# Patient Record
Sex: Male | Born: 2000 | Hispanic: Yes | Marital: Single | Smoking: Never smoker
Health system: Southern US, Community
[De-identification: ages and names within clinical notes are randomized; demographics above are authoritative.]

## PROBLEM LIST (undated history)

## (undated) DIAGNOSIS — J45909 Unspecified asthma, uncomplicated: Secondary | ICD-10-CM

---
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
## 2024-04-03 ENCOUNTER — Ambulatory Visit (INDEPENDENT_AMBULATORY_CARE_PROVIDER_SITE_OTHER): Payer: Self-pay | Admitting: Family

## 2024-04-03 ENCOUNTER — Encounter: Payer: Self-pay | Admitting: Family

## 2024-04-03 VITALS — BP 110/75 | HR 67 | Ht 69.5 in | Wt 202.8 lb

## 2024-04-03 DIAGNOSIS — R21 Rash and other nonspecific skin eruption: Secondary | ICD-10-CM

## 2024-04-03 DIAGNOSIS — Z114 Encounter for screening for human immunodeficiency virus [HIV]: Secondary | ICD-10-CM | POA: Diagnosis not present

## 2024-04-03 DIAGNOSIS — Z13220 Encounter for screening for lipoid disorders: Secondary | ICD-10-CM | POA: Diagnosis not present

## 2024-04-03 DIAGNOSIS — Z7689 Persons encountering health services in other specified circumstances: Secondary | ICD-10-CM

## 2024-04-03 DIAGNOSIS — Z1329 Encounter for screening for other suspected endocrine disorder: Secondary | ICD-10-CM | POA: Diagnosis not present

## 2024-04-03 DIAGNOSIS — Z1159 Encounter for screening for other viral diseases: Secondary | ICD-10-CM

## 2024-04-03 DIAGNOSIS — Z0000 Encounter for general adult medical examination without abnormal findings: Secondary | ICD-10-CM | POA: Diagnosis not present

## 2024-04-03 DIAGNOSIS — Z0289 Encounter for other administrative examinations: Secondary | ICD-10-CM

## 2024-04-03 DIAGNOSIS — Z130 Encounter for screening for diseases of the blood and blood-forming organs and certain disorders involving the immune mechanism: Secondary | ICD-10-CM | POA: Diagnosis not present

## 2024-04-03 DIAGNOSIS — Z131 Encounter for screening for diabetes mellitus: Secondary | ICD-10-CM

## 2024-04-03 DIAGNOSIS — Z13228 Encounter for screening for other metabolic disorders: Secondary | ICD-10-CM | POA: Diagnosis not present

## 2024-04-03 MED ORDER — TRIAMCINOLONE ACETONIDE 0.025 % EX CREA: 1.0000 | TOPICAL_CREAM | Freq: Two times a day (BID) | CUTANEOUS | 1 refills | Status: AC

## 2024-04-03 NOTE — Progress Notes (Signed)
 Subjective:    Alan Fischer - 23 y.o. male MRN [id number]  Date of birth: Jun 22, 2001  HPI  Alan Fischer is to establish care and annual exam.   Current issues and/or concerns: - Rash of bilateral arms present for a while. Denies red flag symptoms.  - Patient has a form from his employer for primary provider to sign that he received his annual exam on today.  - No further issues/concerns for discussion today.  ROS per HPI     Health Maintenance:  Health Maintenance Due  Topic Date Due   HPV VACCINES (1 - Male 3-dose series) Never done   HIV Screening  Never done   Meningococcal B Vaccine (1 of 2 - Standard) Never done   Hepatitis C Screening  Never done   DTaP/Tdap/Td (1 - Tdap) Never done   COVID-19 Vaccine (1 - 2024-25 season) Never done     Past Medical History: There are no active problems to display for this patient.     Social History   reports that he has never smoked. He has never used smokeless tobacco. He reports that he does not drink alcohol and does not use drugs.   Family History  family history is not on file.   Medications: reviewed and updated   Objective:   Physical Exam BP 110/75 (BP Location: Left Arm, Cuff Size: Normal)   Pulse 67   Ht 5' 9.5" (1.765 m)   Wt 202 lb 12.8 oz (92 kg)   SpO2 98%   BMI 29.52 kg/m   Physical Exam HENT:     Head: Normocephalic and atraumatic.     Right Ear: Tympanic membrane, ear canal and external ear normal.     Left Ear: Tympanic membrane, ear canal and external ear normal.     Nose: Nose normal.     Mouth/Throat:     Mouth: Mucous membranes are moist.     Pharynx: Oropharynx is clear.  Eyes:     Extraocular Movements: Extraocular movements intact.     Conjunctiva/sclera: Conjunctivae normal.     Pupils: Pupils are equal, round, and reactive to light.  Neck:     Thyroid: No thyroid mass, thyromegaly or thyroid tenderness.  Cardiovascular:     Rate and Rhythm: Normal rate and regular  rhythm.     Pulses: Normal pulses.     Heart sounds: Normal heart sounds.  Pulmonary:     Effort: Pulmonary effort is normal.     Breath sounds: Normal breath sounds.  Abdominal:     General: Bowel sounds are normal.     Palpations: Abdomen is soft.  Genitourinary:    Comments: Patient declined. Musculoskeletal:        General: Normal range of motion.     Right shoulder: Normal.     Left shoulder: Normal.     Right upper arm: Normal.     Left upper arm: Normal.     Right elbow: Normal.     Left elbow: Normal.     Right forearm: Normal.     Left forearm: Normal.     Right wrist: Normal.     Left wrist: Normal.     Right hand: Normal.     Left hand: Normal.     Cervical back: Normal, normal range of motion and neck supple.     Thoracic back: Normal.     Lumbar back: Normal.     Right hip: Normal.     Left hip: Normal.  Right upper leg: Normal.     Left upper leg: Normal.     Right knee: Normal.     Left knee: Normal.     Right lower leg: Normal.     Left lower leg: Normal.     Right ankle: Normal.     Left ankle: Normal.     Right foot: Normal.     Left foot: Normal.  Skin:    General: Skin is warm and dry.     Capillary Refill: Capillary refill takes less than 2 seconds.     Findings: Rash present.  Neurological:     General: No focal deficit present.     Mental Status: He is alert and oriented to person, place, and time.  Psychiatric:        Mood and Affect: Mood normal.        Behavior: Behavior normal.         Assessment & Plan:  1. Encounter to establish care (Primary) 2. Annual physical exam - Counseled on 150 minutes of exercise per week as tolerated, healthy eating (including decreased daily intake of saturated fats, cholesterol, added sugars, sodium), STI prevention, and routine healthcare maintenance.  3. Screening for metabolic disorder - Routine screening.  - CMP14+EGFR  4. Screening for deficiency anemia - Routine screening.  - CBC  5.  Diabetes mellitus screening - Routine screening.  - Hemoglobin A1c  6. Screening cholesterol level - Routine screening.  - Lipid panel  7. Thyroid disorder screen - Routine screening.  - TSH  8. Encounter for screening for HIV - Routine screening.  - HIV antibody (with reflex)  9. Need for hepatitis C screening test - Routine screening.  - Hepatitis C Antibody  10. Rash and nonspecific skin eruption - Triamcinolone cream as prescribed. Counseled on medication adherence/adverse effects. - Patient declined referral to Dermatology. - Follow-up with primary provider as scheduled. - triamcinolone (KENALOG) 0.025 % cream; Apply 1 Application topically 2 (two) times daily.  Dispense: 60 g; Refill: 1  11. Encounter for completion of form with patient - Form signed from patient's employer.     Patient was given clear instructions to go to Emergency Department or return to medical center if symptoms don't improve, worsen, or new problems develop.The patient verbalized understanding.  I discussed the assessment and treatment plan with the patient. The patient was provided an opportunity to ask questions and all were answered. The patient agreed with the plan and demonstrated an understanding of the instructions.   The patient was advised to call back or seek an in-person evaluation if the symptoms worsen or if the condition fails to improve as anticipated.    Lavona Pounds, NP 04/03/2024, 9:24 AM Primary Care at [facility]

## 2024-04-04 ENCOUNTER — Ambulatory Visit: Payer: Self-pay | Admitting: Family

## 2024-04-04 LAB — CMP14+EGFR
ALT: 21 IU/L (ref 0–44)
AST: 19 IU/L (ref 0–40)
Albumin: 5 g/dL (ref 4.3–5.2)
Alkaline Phosphatase: 87 IU/L (ref 51–125)
BUN/Creatinine Ratio: 11 (ref 9–20)
BUN: 8 mg/dL (ref 6–20)
Bilirubin Total: 0.6 mg/dL (ref 0.0–1.2)
CO2: 22 mmol/L (ref 20–29)
Calcium: 9.7 mg/dL (ref 8.7–10.2)
Chloride: 101 mmol/L (ref 96–106)
Creatinine, Ser: 0.75 mg/dL — ABNORMAL LOW (ref 0.76–1.27)
Globulin, Total: 2.5 g/dL (ref 1.5–4.5)
Glucose: 91 mg/dL (ref 70–99)
Potassium: 4.4 mmol/L (ref 3.5–5.2)
Sodium: 139 mmol/L (ref 134–144)
Total Protein: 7.5 g/dL (ref 6.0–8.5)
eGFR: 132 mL/min/{1.73_m2} (ref >59)

## 2024-04-04 LAB — CBC
Hematocrit: 46.4 % (ref 37.5–51.0)
Hemoglobin: 14.8 g/dL (ref 13.0–17.7)
MCH: 27.1 pg (ref 26.6–33.0)
MCHC: 31.9 g/dL (ref 31.5–35.7)
MCV: 85 fL (ref 79–97)
Platelets: 233 10*3/uL (ref 150–450)
RBC: 5.46 x10E6/uL (ref 4.14–5.80)
RDW: 14 % (ref 11.6–15.4)
WBC: 4.5 10*3/uL (ref 3.4–10.8)

## 2024-04-04 LAB — TSH: TSH: 3.6 u[IU]/mL (ref 0.450–4.500)

## 2024-04-04 LAB — LIPID PANEL
Chol/HDL Ratio: 3.3 ratio (ref 0.0–5.0)
Cholesterol, Total: 160 mg/dL (ref 100–199)
HDL: 49 mg/dL (ref >39)
LDL Chol Calc (NIH): 94 mg/dL (ref 0–99)
Triglycerides: 94 mg/dL (ref 0–149)
VLDL Cholesterol Cal: 17 mg/dL (ref 5–40)

## 2024-04-04 LAB — HEMOGLOBIN A1C
Est. average glucose Bld gHb Est-mCnc: 108 mg/dL
Hgb A1c MFr Bld: 5.4 % (ref 4.8–5.6)

## 2024-04-04 LAB — HIV ANTIBODY (ROUTINE TESTING W REFLEX): HIV Screen 4th Generation wRfx: NONREACTIVE

## 2024-04-04 LAB — HEPATITIS C ANTIBODY: Hep C Virus Ab: NONREACTIVE

## 2025-04-03 ENCOUNTER — Encounter: Admitting: Family
